# Patient Record
Sex: Male | Born: 1975 | Race: White | Hispanic: No | Marital: Married | Smoking: Never smoker
Health system: Southern US, Community
[De-identification: ages and names within clinical notes are randomized; demographics above are authoritative.]

## PROBLEM LIST (undated history)

## (undated) HISTORY — PX: LAPAROSCOPIC GASTRIC SLEEVE RESECTION: SHX5895

---

## 2018-08-12 ENCOUNTER — Other Ambulatory Visit: Payer: Self-pay

## 2018-08-12 ENCOUNTER — Emergency Department: Payer: Self-pay

## 2018-08-12 ENCOUNTER — Emergency Department
Admission: EM | Admit: 2018-08-12 | Discharge: 2018-08-12 | Discharge status: Against Medical Advice | Payer: Self-pay | Attending: Emergency Medicine | Admitting: Emergency Medicine

## 2018-08-12 ENCOUNTER — Encounter: Payer: Self-pay | Admitting: Emergency Medicine

## 2018-08-12 DIAGNOSIS — R079 Chest pain, unspecified: Secondary | ICD-10-CM | POA: Insufficient documentation

## 2018-08-12 DIAGNOSIS — Z5321 Procedure and treatment not carried out due to patient leaving prior to being seen by health care provider: Secondary | ICD-10-CM | POA: Insufficient documentation

## 2018-08-12 LAB — BASIC METABOLIC PANEL WITH GFR
Anion gap: 8 (ref 5–15)
BUN: 19 mg/dL (ref 6–20)
CO2: 29 mmol/L (ref 22–32)
Calcium: 9 mg/dL (ref 8.9–10.3)
Chloride: 104 mmol/L (ref 98–111)
Creatinine, Ser: 1.23 mg/dL (ref 0.61–1.24)
GFR calc Af Amer: >60 mL/min
GFR calc non Af Amer: >60 mL/min
Glucose, Bld: 91 mg/dL (ref 70–99)
Potassium: 4.4 mmol/L (ref 3.5–5.1)
Sodium: 141 mmol/L (ref 135–145)

## 2018-08-12 LAB — CBC
HCT: 48.2 % (ref 39.0–52.0)
Hemoglobin: 16.1 g/dL (ref 13.0–17.0)
MCH: 29.9 pg (ref 26.0–34.0)
MCHC: 33.4 g/dL (ref 30.0–36.0)
MCV: 89.6 fL (ref 80.0–100.0)
Platelets: 177 10*3/uL (ref 150–400)
RBC: 5.38 MIL/uL (ref 4.22–5.81)
RDW: 14.1 % (ref 11.5–15.5)
WBC: 6.8 10*3/uL (ref 4.0–10.5)
nRBC: 0 % (ref 0.0–0.2)

## 2018-08-12 LAB — TROPONIN I: Troponin I: <0.03 ng/mL (ref <0.03)

## 2018-08-12 MED ORDER — SODIUM CHLORIDE 0.9% FLUSH: 3.0000 mL | Freq: Once | INTRAVENOUS | Status: DC

## 2018-08-12 NOTE — ED Notes (Addendum)
Pt no longer wishing to wait to be seen. Pt advised to f/u with PCP, pt from out of town, given hospital number to get MR's sent to PCP.

## 2018-08-12 NOTE — ED Triage Notes (Signed)
PT c/o sudden onset of CP during lunch, aprox PTA. PT states dizziness and nausea as well. 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 VSS

## 2018-08-13 ENCOUNTER — Telehealth: Payer: Self-pay | Admitting: Emergency Medicine

## 2018-08-13 NOTE — Telephone Encounter (Signed)
Called patient due to lwot to inquire about condition and follow up plans. Unable to call due to  No phone number listed

## 2020-01-30 IMAGING — CR DG CHEST 2V
2 series · 2 of 2 positions shown · non-contrast
Comparison: None.

CLINICAL DATA: Chest pain for 10 minutes.

EXAM:
CHEST - 2 VIEW

[chest pa]
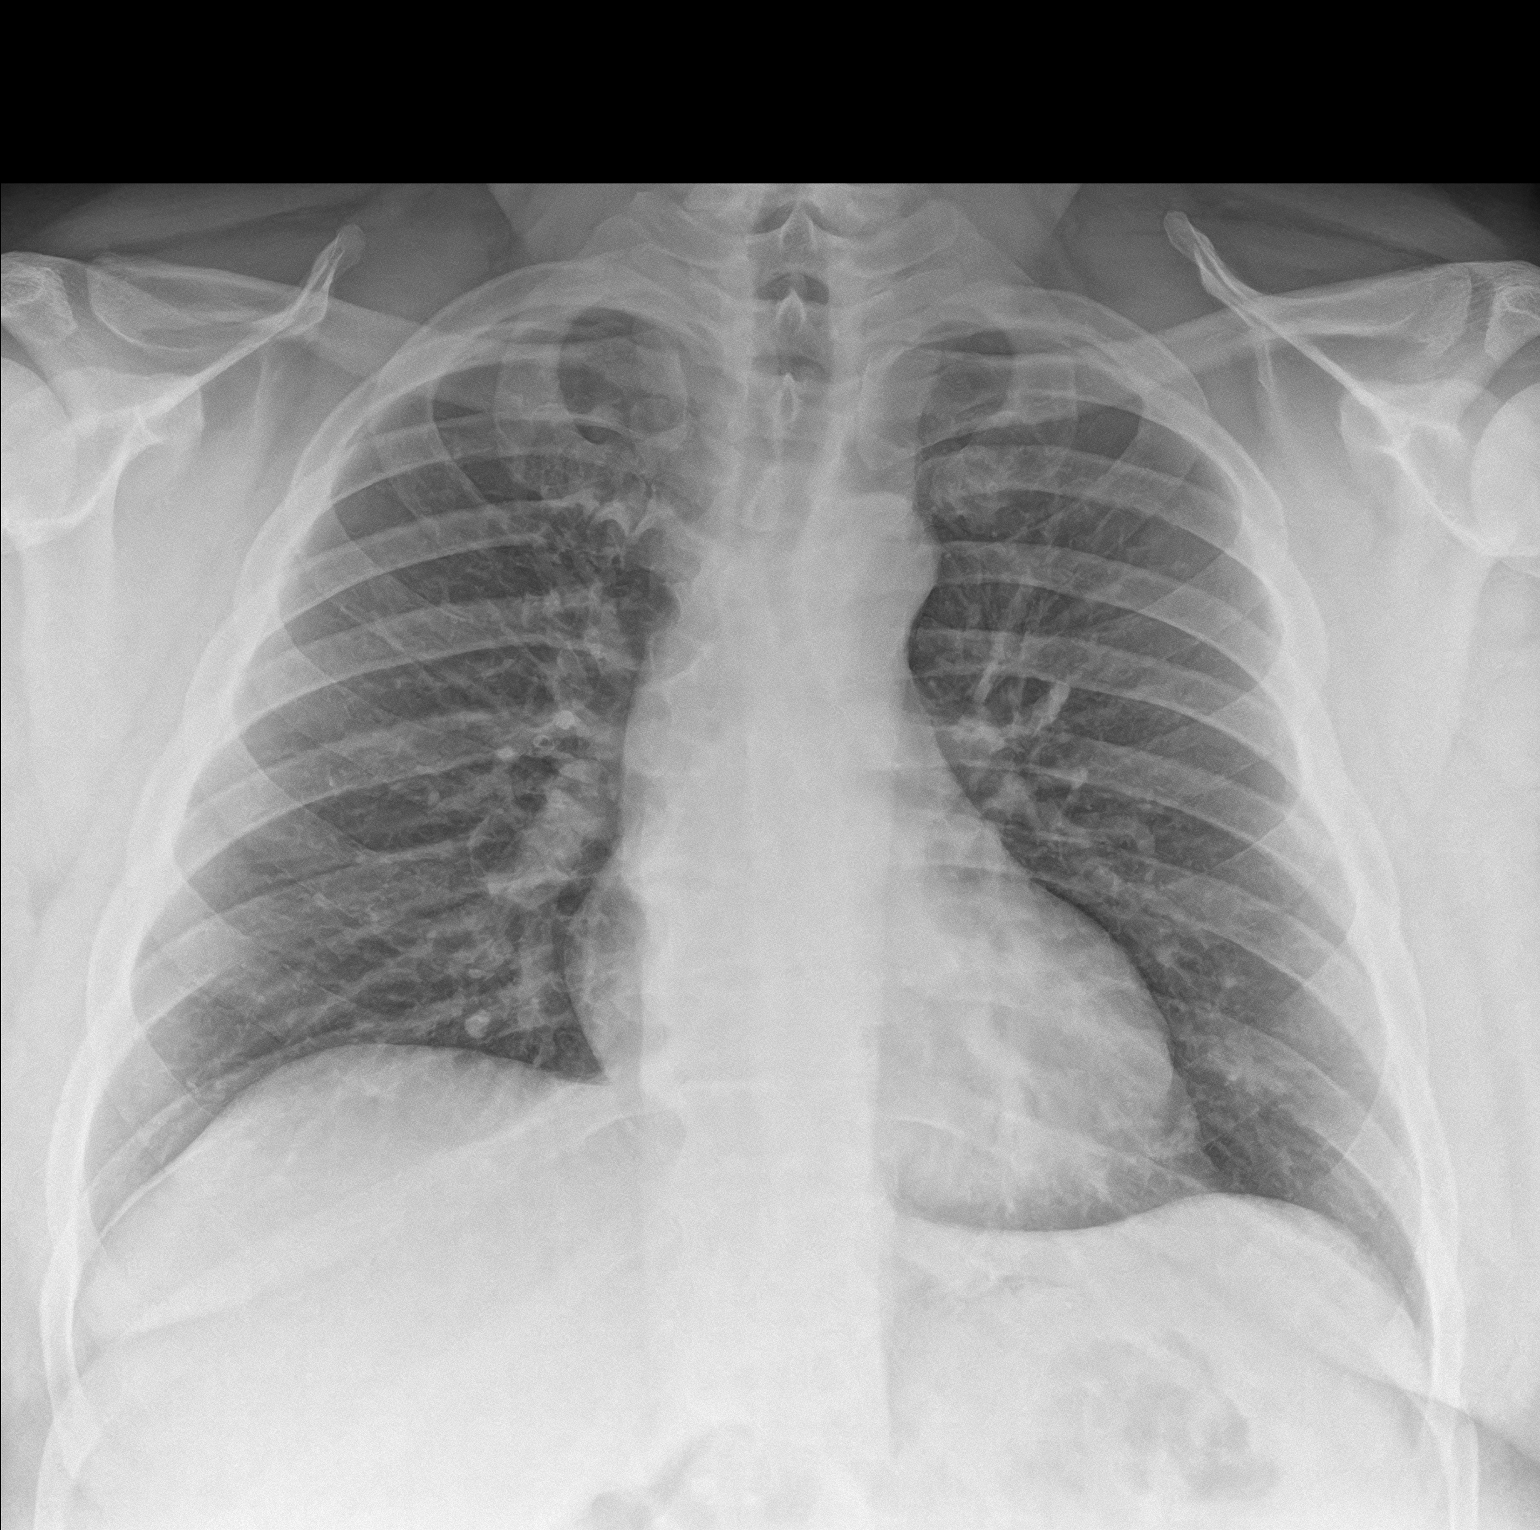

[chest lat]
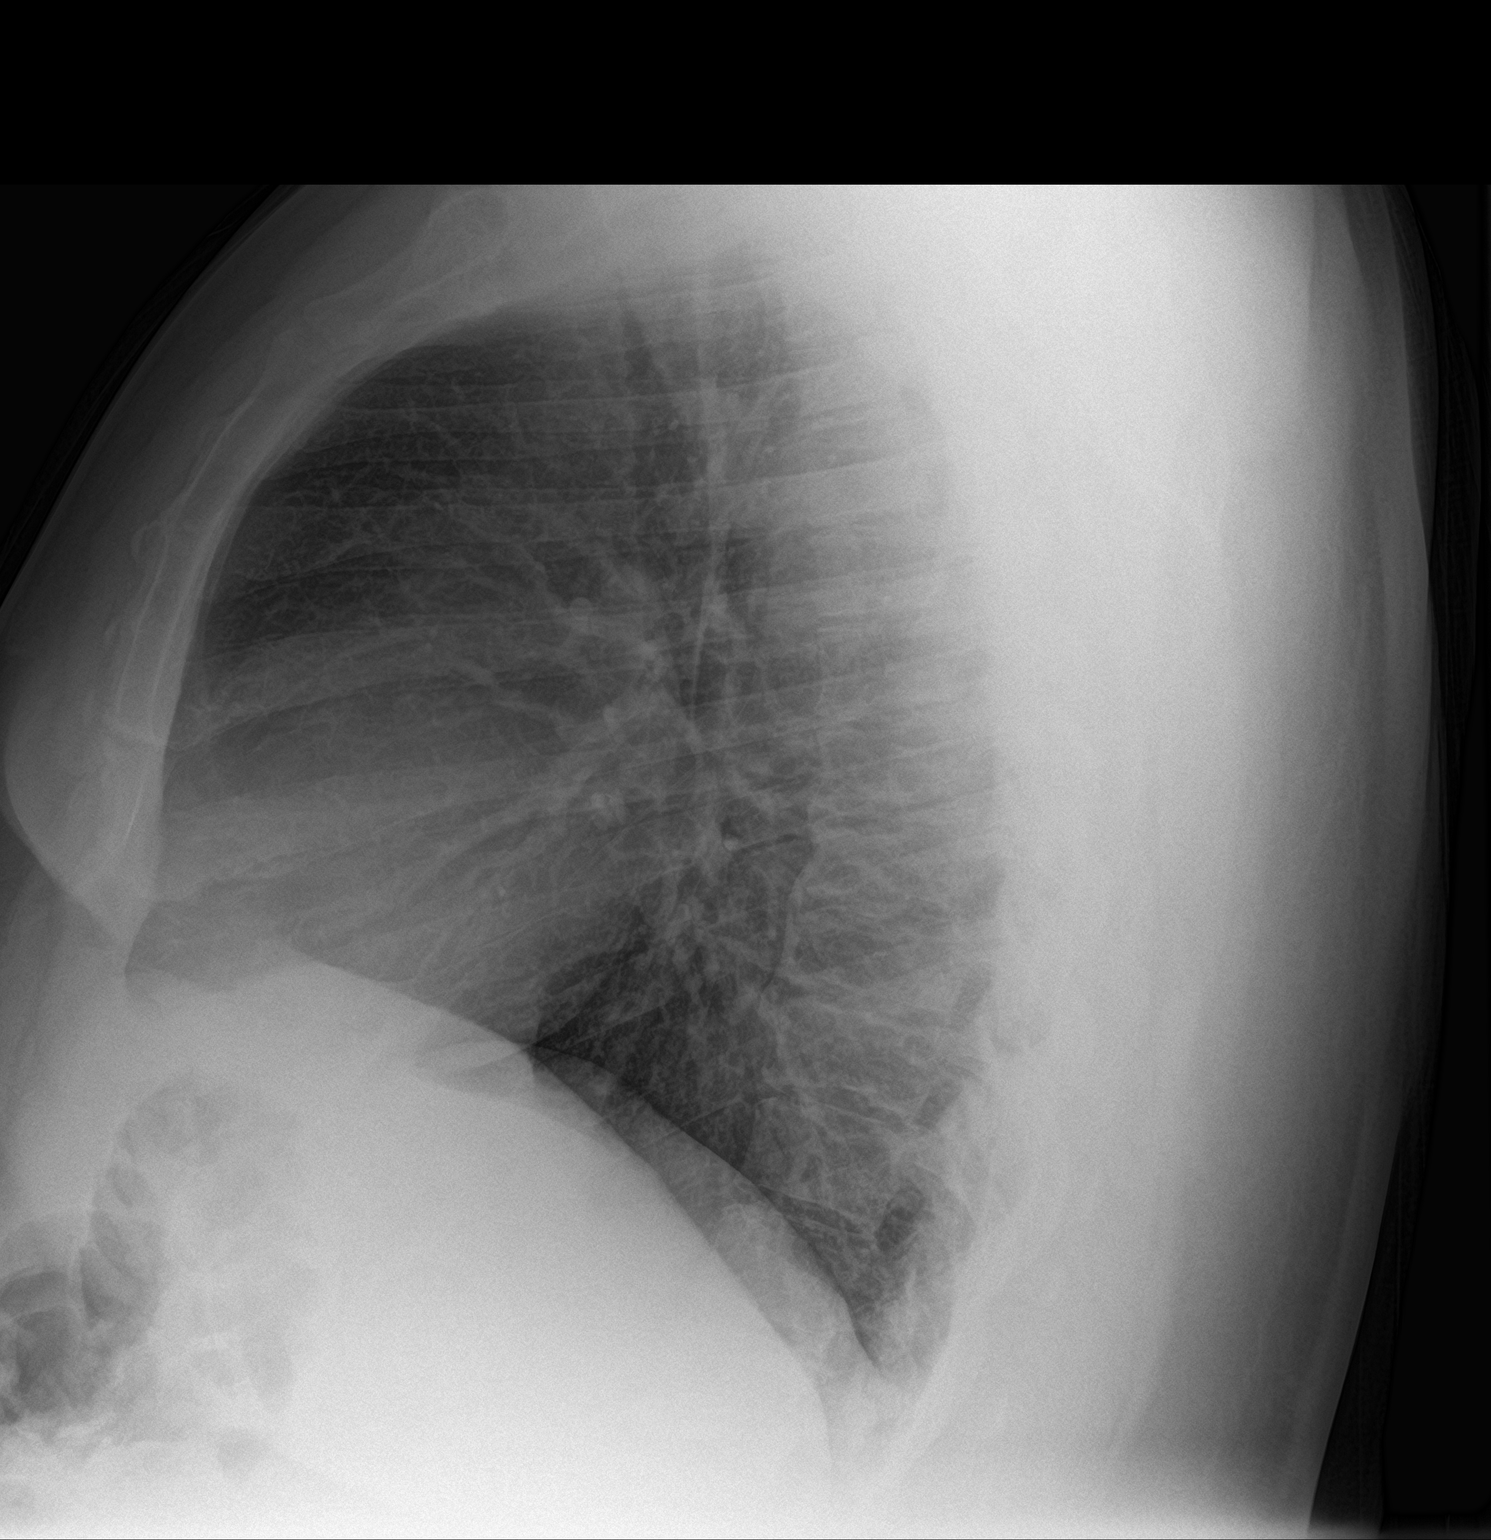

[2 of 2 positions shown; findings below may reference images not displayed]

FINDINGS: The heart size and mediastinal contours are within normal limits.
Both lungs are clear. Degenerative joint changes of the spine are
noted.
IMPRESSION: No active cardiopulmonary disease.
